# Patient Record
Sex: Male | Born: 2005 | Race: White | Hispanic: No | Marital: Single | Smoking: Never smoker
Health system: Southern US, Community
[De-identification: ages and names within clinical notes are randomized; demographics above are authoritative.]

---
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
## 2024-01-29 ENCOUNTER — Encounter (HOSPITAL_COMMUNITY): Payer: Self-pay | Admitting: *Deleted

## 2024-01-29 ENCOUNTER — Emergency Department (HOSPITAL_COMMUNITY)
Admission: EM | Admit: 2024-01-29 | Discharge: 2024-01-30 | Discharge status: Against Medical Advice | Attending: Emergency Medicine | Admitting: Emergency Medicine

## 2024-01-29 ENCOUNTER — Emergency Department (HOSPITAL_COMMUNITY)

## 2024-01-29 ENCOUNTER — Other Ambulatory Visit: Payer: Self-pay

## 2024-01-29 DIAGNOSIS — Z5321 Procedure and treatment not carried out due to patient leaving prior to being seen by health care provider: Secondary | ICD-10-CM | POA: Insufficient documentation

## 2024-01-29 DIAGNOSIS — R0789 Other chest pain: Secondary | ICD-10-CM | POA: Insufficient documentation

## 2024-01-29 DIAGNOSIS — R0602 Shortness of breath: Secondary | ICD-10-CM | POA: Insufficient documentation

## 2024-01-29 LAB — TROPONIN I (HIGH SENSITIVITY)
Troponin I (High Sensitivity): <2 ng/L (ref <18)
Troponin I (High Sensitivity): <2 ng/L (ref <18)

## 2024-01-29 LAB — CBC WITH DIFFERENTIAL/PLATELET
Abs Immature Granulocytes: 0.02 K/uL (ref 0.00–0.07)
Basophils Absolute: 0.1 K/uL (ref 0.0–0.1)
Basophils Relative: 1 %
Eosinophils Absolute: 0.2 K/uL (ref 0.0–0.5)
Eosinophils Relative: 2 %
HCT: 43.8 % (ref 39.0–52.0)
Hemoglobin: 14.8 g/dL (ref 13.0–17.0)
Immature Granulocytes: 0 %
Lymphocytes Relative: 27 %
Lymphs Abs: 2 K/uL (ref 0.7–4.0)
MCH: 28.5 pg (ref 26.0–34.0)
MCHC: 33.8 g/dL (ref 30.0–36.0)
MCV: 84.4 fL (ref 80.0–100.0)
Monocytes Absolute: 0.5 K/uL (ref 0.1–1.0)
Monocytes Relative: 7 %
Neutro Abs: 4.8 K/uL (ref 1.7–7.7)
Neutrophils Relative %: 63 %
Platelets: 315 K/uL (ref 150–400)
RBC: 5.19 MIL/uL (ref 4.22–5.81)
RDW: 13 % (ref 11.5–15.5)
WBC: 7.5 K/uL (ref 4.0–10.5)
nRBC: 0 % (ref 0.0–0.2)

## 2024-01-29 LAB — BRAIN NATRIURETIC PEPTIDE: B Natriuretic Peptide: 2.4 pg/mL (ref 0.0–100.0)

## 2024-01-29 LAB — RESP PANEL BY RT-PCR (RSV, FLU A&B, COVID)  RVPGX2
Influenza A by PCR: NEGATIVE
Influenza B by PCR: NEGATIVE
Resp Syncytial Virus by PCR: NEGATIVE
SARS Coronavirus 2 by RT PCR: NEGATIVE

## 2024-01-29 LAB — BASIC METABOLIC PANEL WITH GFR
Anion gap: 13 (ref 5–15)
BUN: 20 mg/dL (ref 6–20)
CO2: 21 mmol/L — ABNORMAL LOW (ref 22–32)
Calcium: 9.3 mg/dL (ref 8.9–10.3)
Chloride: 103 mmol/L (ref 98–111)
Creatinine, Ser: 0.88 mg/dL (ref 0.61–1.24)
GFR, Estimated: >60 mL/min (ref >60)
Glucose, Bld: 130 mg/dL — ABNORMAL HIGH (ref 70–99)
Potassium: 3.6 mmol/L (ref 3.5–5.1)
Sodium: 137 mmol/L (ref 135–145)

## 2024-01-29 MED ORDER — ONDANSETRON 4 MG PO TBDP
4.0000 mg | ORAL_TABLET | Freq: Once | ORAL | Status: AC
  Administered 2024-01-29: 4 mg via ORAL
  Filled 2024-01-29: qty 1

## 2024-01-29 NOTE — ED Notes (Signed)
 Pt told this NT he is ready to go. Pt seen leaving with family.

## 2024-01-29 NOTE — ED Triage Notes (Signed)
 C/O chest pain for a couple of hours, non radiating. Denies SHOB.  C/O nausea.

## 2024-01-29 NOTE — ED Provider Triage Note (Signed)
 Emergency Medicine Provider Triage Evaluation Note  Alexander Glass , a 18 y.o. male  was evaluated in triage.  Pt complains of cp. Acute onset of midsternal cp with sob that started earlier today. No fever, chills, cough, congestion, sore throat, n/v/d, back pain  Review of Systems  Positive: As above Negative: As above  Physical Exam  BP 126/70   Pulse 70   Temp 98.2 F (36.8 C) (Oral)   Resp 18   Ht 5' 11 (1.803 m)   Wt 99.8 kg   SpO2 100%   BMI 30.68 kg/m  Gen:   Awake, no distress   Resp:  Normal effort  MSK:   Moves extremities without difficulty  Other:    Medical Decision Making  Medically screening exam initiated at 4:42 PM.  Appropriate orders placed.  Alexander Glass was informed that the remainder of the evaluation will be completed by another provider, this initial triage assessment does not replace that evaluation, and the importance of remaining in the ED until their evaluation is complete.  PERC negative.     Alexander Colon, PA-C 01/29/24 1642
